# Patient Record
Sex: Female | Born: 1960 | Race: White | Hispanic: No | Marital: Married | Smoking: Current every day smoker
Health system: Southern US, Community
[De-identification: ages and names within clinical notes are randomized; demographics above are authoritative.]

## PROBLEM LIST (undated history)

## (undated) DIAGNOSIS — K5790 Diverticulosis of intestine, part unspecified, without perforation or abscess without bleeding: Secondary | ICD-10-CM

## (undated) DIAGNOSIS — G4761 Periodic limb movement disorder: Secondary | ICD-10-CM

## (undated) DIAGNOSIS — G4700 Insomnia, unspecified: Secondary | ICD-10-CM

## (undated) DIAGNOSIS — L309 Dermatitis, unspecified: Secondary | ICD-10-CM

## (undated) DIAGNOSIS — M25511 Pain in right shoulder: Secondary | ICD-10-CM

## (undated) DIAGNOSIS — Z87891 Personal history of nicotine dependence: Secondary | ICD-10-CM

## (undated) DIAGNOSIS — F419 Anxiety disorder, unspecified: Secondary | ICD-10-CM

## (undated) HISTORY — PX: OTHER SURGICAL HISTORY: SHX169

## (undated) HISTORY — DX: Diverticulosis of intestine, part unspecified, without perforation or abscess without bleeding: K57.90

## (undated) HISTORY — DX: Periodic limb movement disorder: G47.61

## (undated) HISTORY — DX: Dermatitis, unspecified: L30.9

## (undated) HISTORY — DX: Anxiety disorder, unspecified: F41.9

## (undated) HISTORY — DX: Insomnia, unspecified: G47.00

## (undated) HISTORY — DX: Pain in right shoulder: M25.511

## (undated) HISTORY — DX: Personal history of nicotine dependence: Z87.891

---

## 2007-04-03 ENCOUNTER — Other Ambulatory Visit: Admission: RE | Admit: 2007-04-03 | Discharge: 2007-04-03 | Payer: Self-pay | Admitting: Family Medicine

## 2010-03-01 ENCOUNTER — Emergency Department (HOSPITAL_COMMUNITY): Admission: EM | Admit: 2010-03-01 | Discharge: 2010-03-02 | Payer: Self-pay | Admitting: Emergency Medicine

## 2010-08-13 LAB — POCT CARDIAC MARKERS: Troponin i, poc: <0.05 ng/mL (ref 0.00–0.09)

## 2010-08-13 LAB — BASIC METABOLIC PANEL
BUN: 10 mg/dL (ref 6–23)
Calcium: 9.6 mg/dL (ref 8.4–10.5)
Chloride: 106 mEq/L (ref 96–112)
Creatinine, Ser: 0.64 mg/dL (ref 0.4–1.2)

## 2010-08-13 LAB — CBC
MCV: 94.2 fL (ref 78.0–100.0)
Platelets: 253 10*3/uL (ref 150–400)
RDW: 13.7 % (ref 11.5–15.5)
WBC: 7.1 10*3/uL (ref 4.0–10.5)

## 2010-08-13 LAB — DIFFERENTIAL
Basophils Absolute: 0 10*3/uL (ref 0.0–0.1)
Eosinophils Absolute: 0.3 10*3/uL (ref 0.0–0.7)
Eosinophils Relative: 4 % (ref 0–5)
Lymphocytes Relative: 42 % (ref 12–46)
Neutrophils Relative %: 46 % (ref 43–77)

## 2012-07-26 ENCOUNTER — Other Ambulatory Visit (HOSPITAL_COMMUNITY)
Admission: RE | Admit: 2012-07-26 | Discharge: 2012-07-26 | Disposition: A | Discharge status: Home / Self Care | Payer: Self-pay | Source: Ambulatory Visit | Attending: Family Medicine | Admitting: Family Medicine

## 2012-07-26 ENCOUNTER — Other Ambulatory Visit: Payer: Self-pay | Admitting: Family Medicine

## 2012-07-26 DIAGNOSIS — Z1151 Encounter for screening for human papillomavirus (HPV): Secondary | ICD-10-CM | POA: Insufficient documentation

## 2012-07-26 DIAGNOSIS — Z124 Encounter for screening for malignant neoplasm of cervix: Secondary | ICD-10-CM | POA: Insufficient documentation

## 2013-10-17 ENCOUNTER — Encounter: Payer: Self-pay | Admitting: Cardiology

## 2014-03-11 ENCOUNTER — Other Ambulatory Visit: Payer: Self-pay | Admitting: Family Medicine

## 2014-03-11 DIAGNOSIS — R109 Unspecified abdominal pain: Secondary | ICD-10-CM

## 2014-03-19 ENCOUNTER — Ambulatory Visit
Admission: RE | Admit: 2014-03-19 | Discharge: 2014-03-19 | Disposition: A | Discharge status: Home / Self Care | Payer: BC Managed Care – PPO | Source: Ambulatory Visit | Attending: Family Medicine | Admitting: Family Medicine

## 2014-03-19 DIAGNOSIS — R109 Unspecified abdominal pain: Secondary | ICD-10-CM

## 2014-07-31 ENCOUNTER — Other Ambulatory Visit: Payer: Self-pay | Admitting: Family Medicine

## 2014-07-31 ENCOUNTER — Ambulatory Visit
Admission: RE | Admit: 2014-07-31 | Discharge: 2014-07-31 | Disposition: A | Discharge status: Home / Self Care | Payer: BLUE CROSS/BLUE SHIELD | Source: Ambulatory Visit | Attending: Family Medicine | Admitting: Family Medicine

## 2014-07-31 DIAGNOSIS — W19XXXA Unspecified fall, initial encounter: Secondary | ICD-10-CM

## 2015-11-10 IMAGING — CR DG RIBS 2V*L*
2 series · 2 of 2 positions shown · non-contrast
Comparison: Chest radiograph 07/31/2014 and 03/01/2010.

CLINICAL DATA: Trauma, initial encounter. Patient tripped and fell,
landing on left upper chest. Moderate and persistent pain in the
left lateral upper chest with painful breathing. Pain is
nonradiating. No swelling or bruising.

EXAM:
LEFT RIBS - 2 VIEW

[view not recorded (1 of 2)]
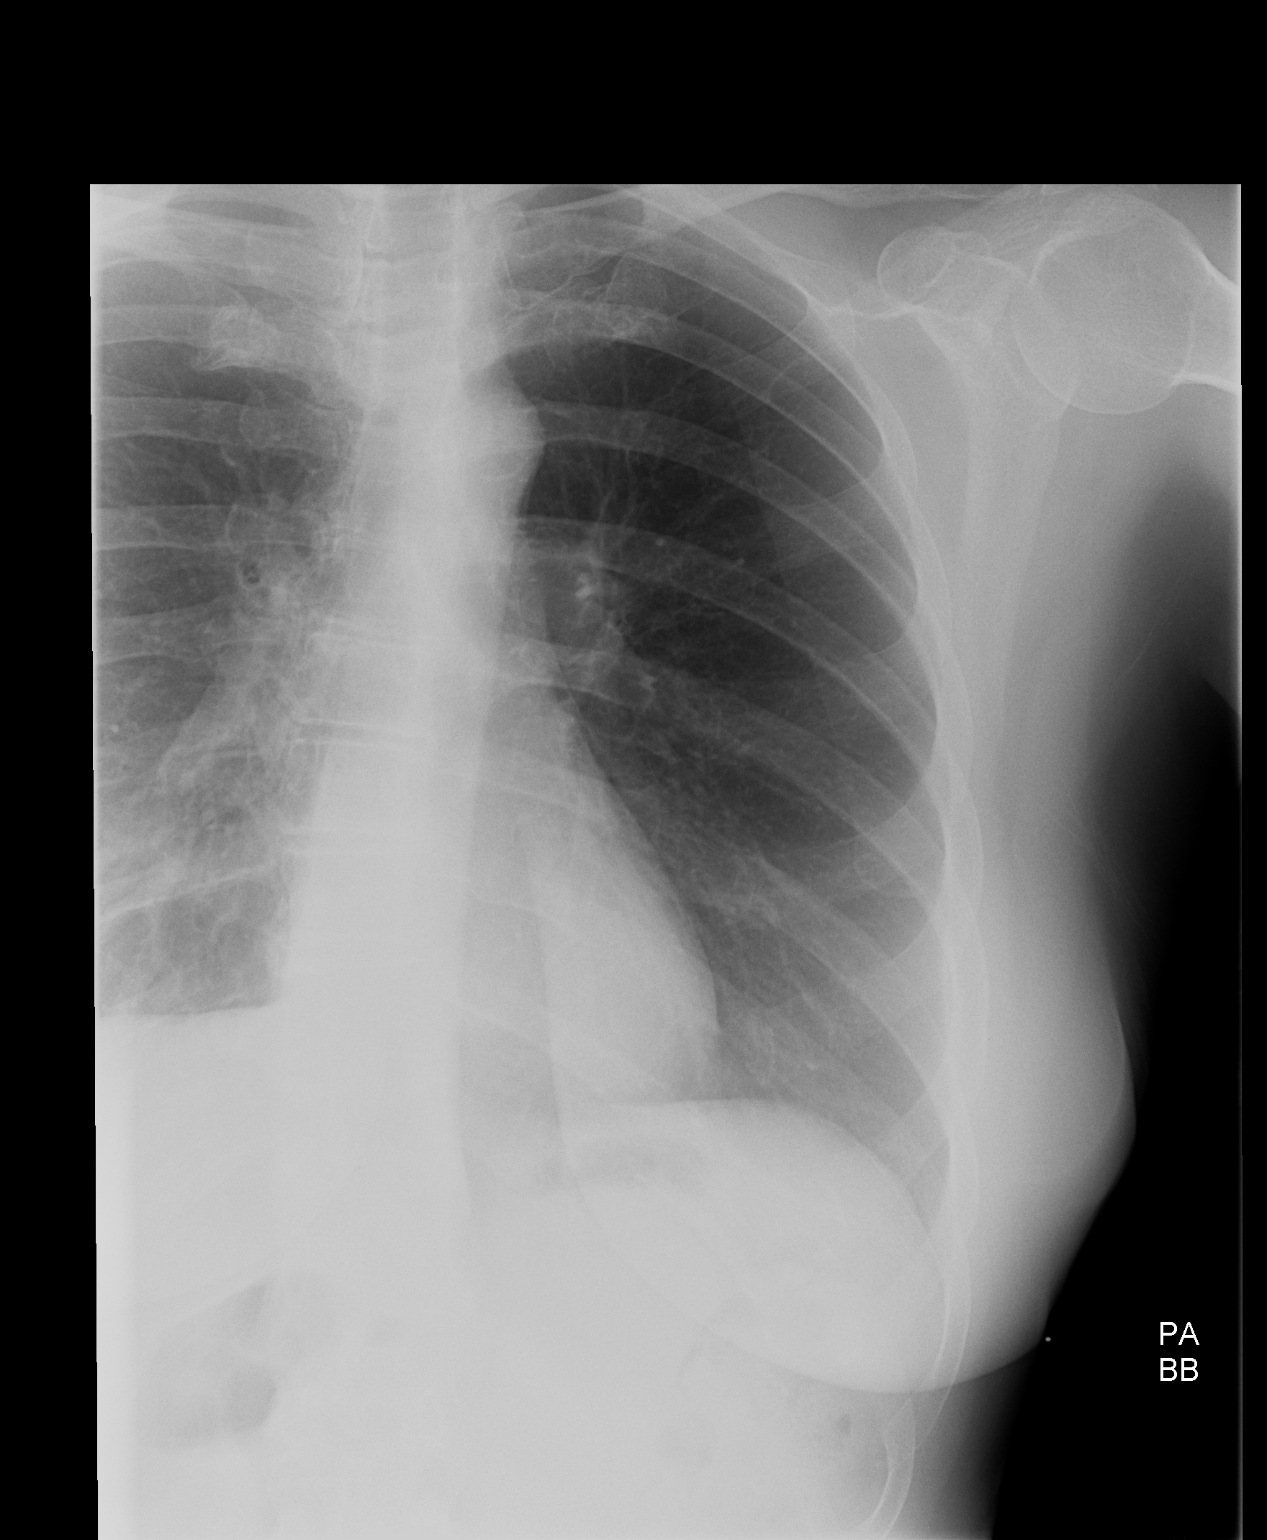

[view not recorded (2 of 2)]
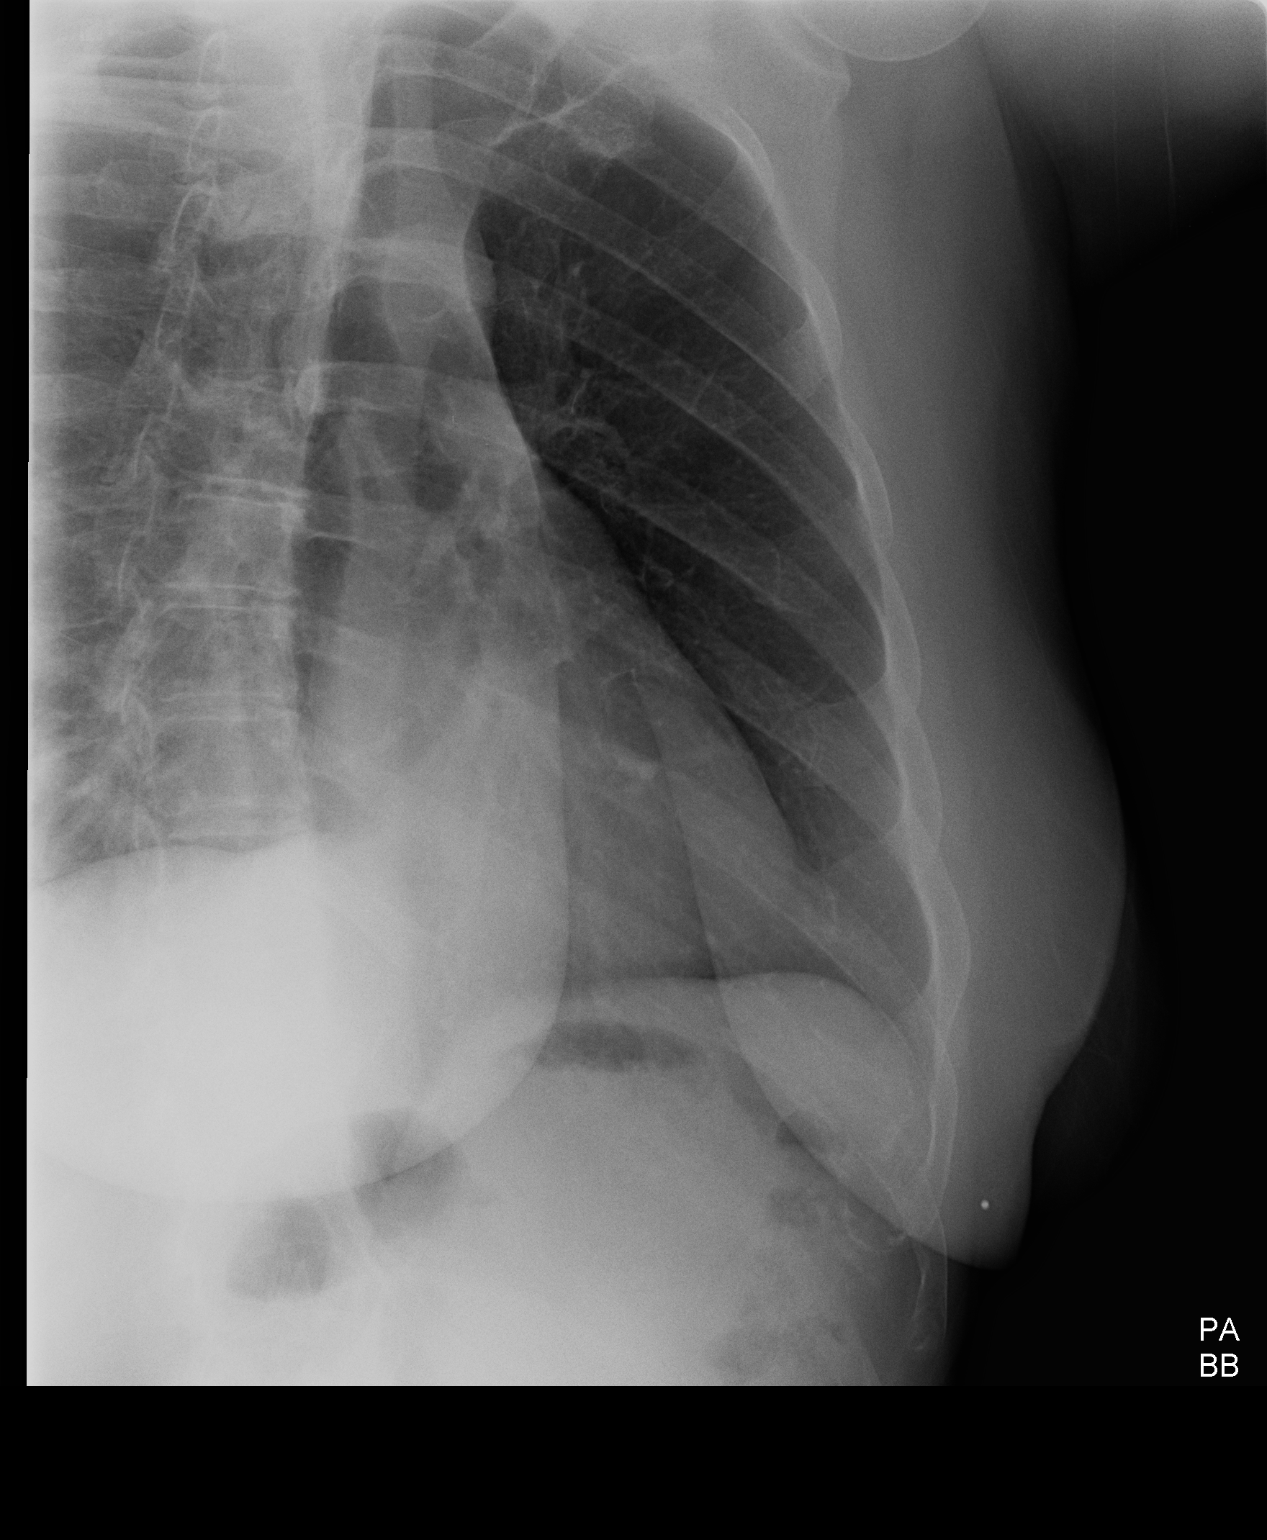

[2 of 2 positions shown; findings below may reference images not displayed]

FINDINGS: No acute osseous abnormality.
IMPRESSION: No acute osseous abnormality.  Chest radiograph dictated separately.

## 2015-11-10 IMAGING — CR DG CHEST 2V
2 series · 2 of 2 positions shown · non-contrast
Comparison: 03/01/2010.

CLINICAL DATA: Initial encounter, trauma. Tripped over someone's
foot and fell last night, landing on left upper chest. Moderate and
persistent pain in the left lateral upper chest with painful
breathing. Pain is nonradiating. No swelling or bruising.

EXAM:
CHEST  2 VIEW

[view not recorded (1 of 2)]
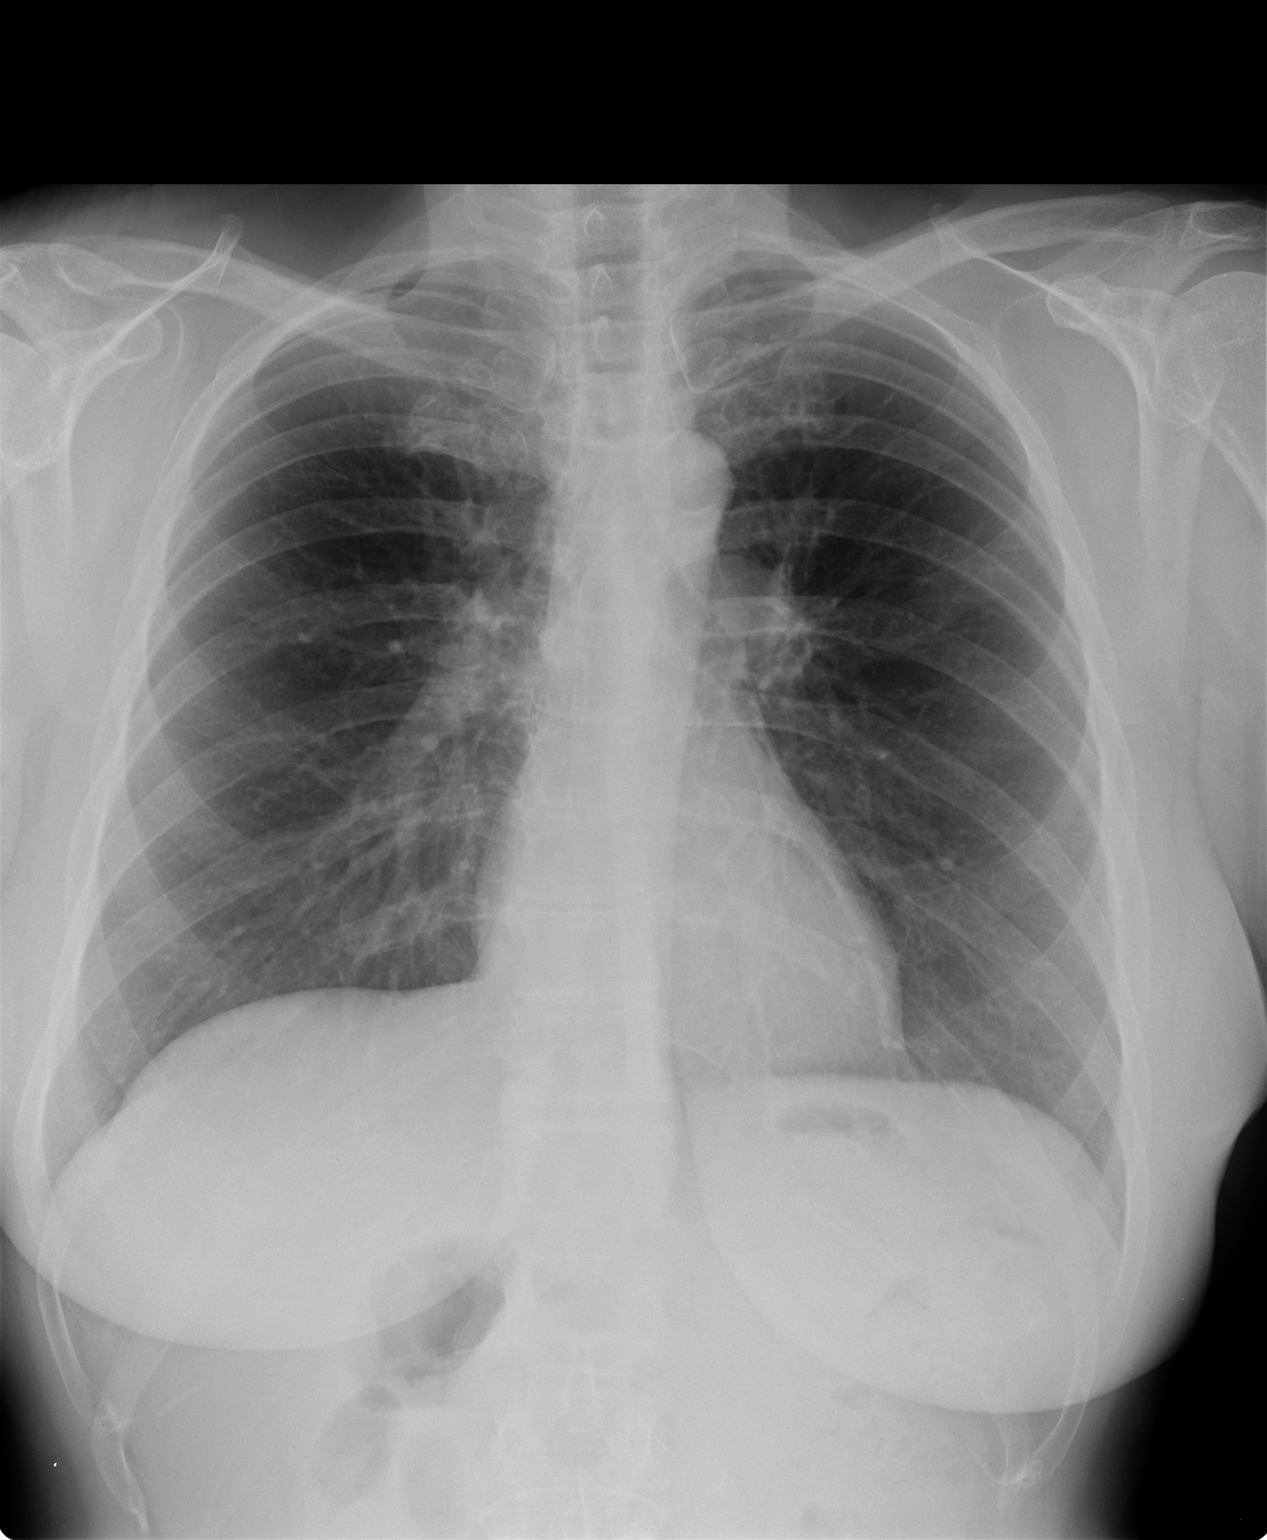

[view not recorded (2 of 2)]
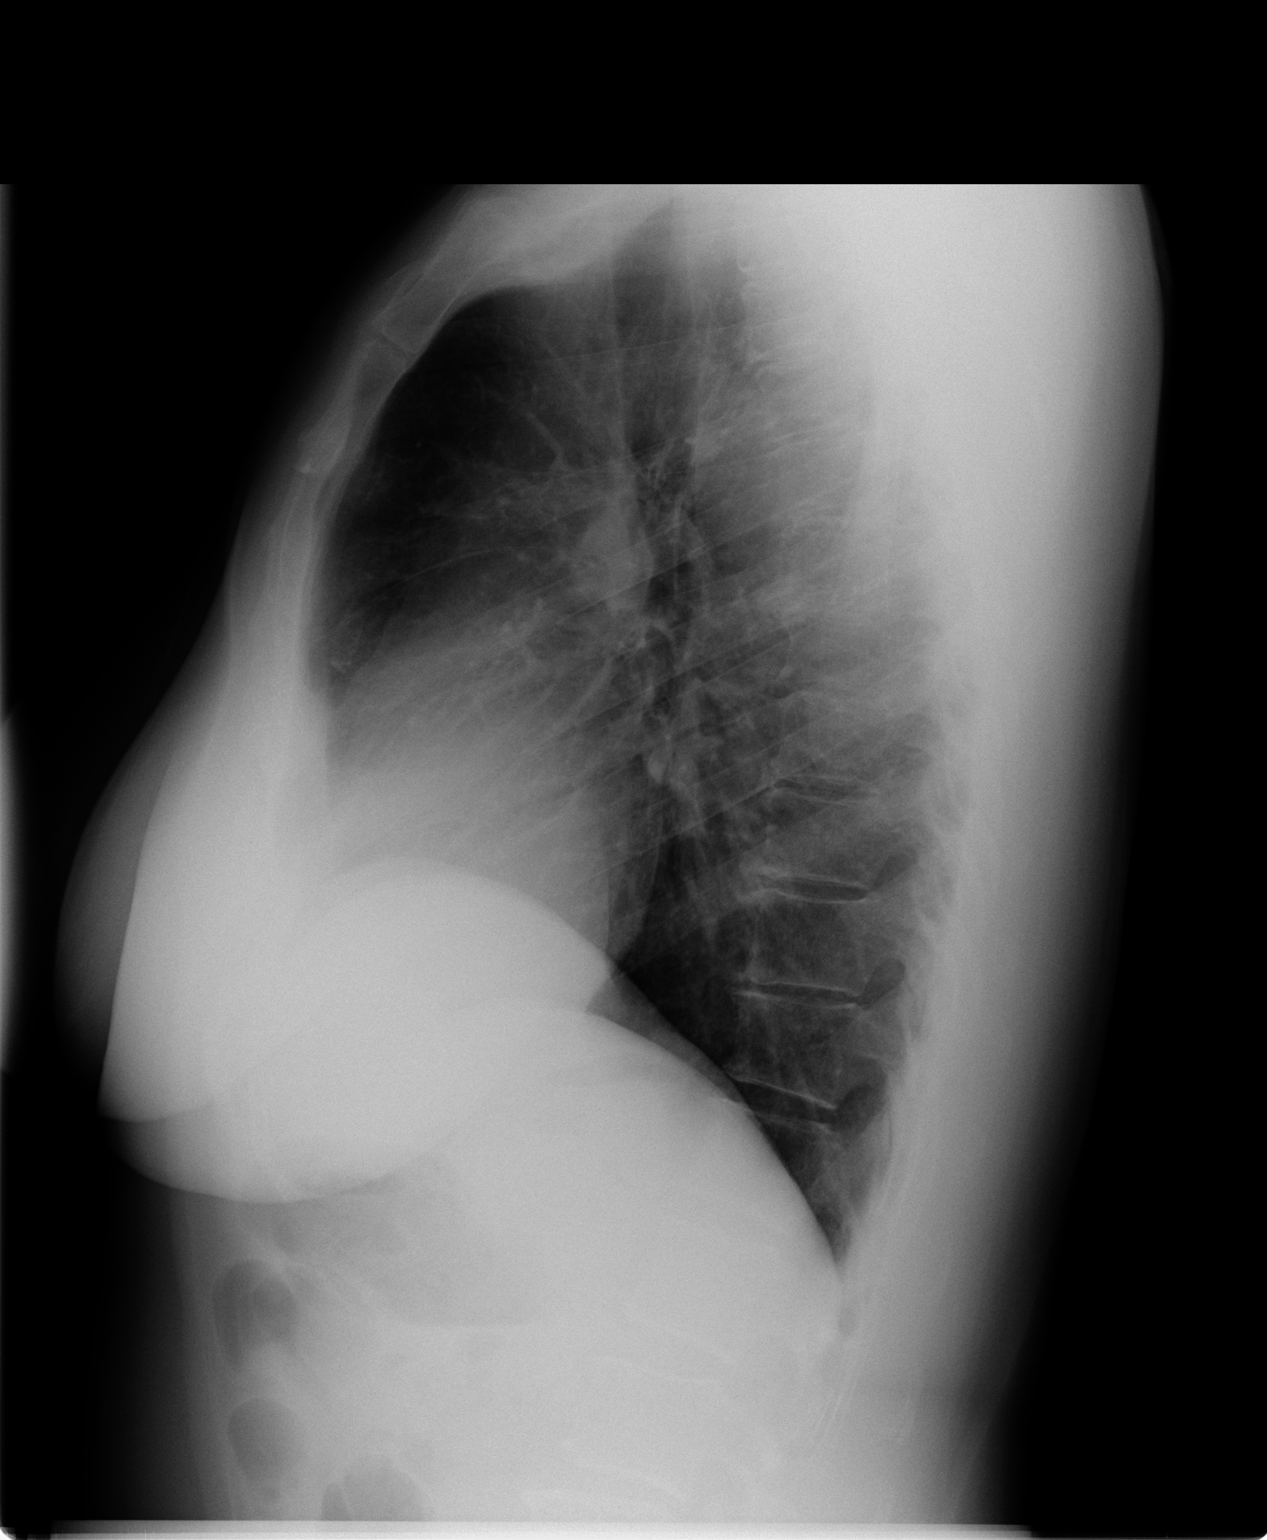

[2 of 2 positions shown; findings below may reference images not displayed]

FINDINGS: Trachea is midline. Heart size normal. Lungs are hyperinflated but
clear. No pleural fluid. No pneumothorax.
IMPRESSION: No acute findings.

## 2018-04-06 DIAGNOSIS — J019 Acute sinusitis, unspecified: Secondary | ICD-10-CM | POA: Diagnosis not present

## 2018-04-06 DIAGNOSIS — L309 Dermatitis, unspecified: Secondary | ICD-10-CM | POA: Diagnosis not present

## 2018-04-24 DIAGNOSIS — R0982 Postnasal drip: Secondary | ICD-10-CM | POA: Diagnosis not present

## 2018-04-24 DIAGNOSIS — Z6825 Body mass index (BMI) 25.0-25.9, adult: Secondary | ICD-10-CM | POA: Diagnosis not present

## 2018-08-01 DIAGNOSIS — L309 Dermatitis, unspecified: Secondary | ICD-10-CM | POA: Diagnosis not present

## 2019-02-02 DIAGNOSIS — D229 Melanocytic nevi, unspecified: Secondary | ICD-10-CM | POA: Diagnosis not present

## 2019-02-02 DIAGNOSIS — L821 Other seborrheic keratosis: Secondary | ICD-10-CM | POA: Diagnosis not present

## 2019-02-02 DIAGNOSIS — L814 Other melanin hyperpigmentation: Secondary | ICD-10-CM | POA: Diagnosis not present

## 2019-02-02 DIAGNOSIS — L281 Prurigo nodularis: Secondary | ICD-10-CM | POA: Diagnosis not present

## 2019-02-07 DIAGNOSIS — Z131 Encounter for screening for diabetes mellitus: Secondary | ICD-10-CM | POA: Diagnosis not present

## 2019-02-07 DIAGNOSIS — Z0000 Encounter for general adult medical examination without abnormal findings: Secondary | ICD-10-CM | POA: Diagnosis not present

## 2019-02-07 DIAGNOSIS — Z114 Encounter for screening for human immunodeficiency virus [HIV]: Secondary | ICD-10-CM | POA: Diagnosis not present

## 2019-02-07 DIAGNOSIS — Z1159 Encounter for screening for other viral diseases: Secondary | ICD-10-CM | POA: Diagnosis not present

## 2019-02-07 DIAGNOSIS — E559 Vitamin D deficiency, unspecified: Secondary | ICD-10-CM | POA: Diagnosis not present

## 2019-02-09 ENCOUNTER — Other Ambulatory Visit: Payer: Self-pay

## 2019-02-09 DIAGNOSIS — Z20822 Contact with and (suspected) exposure to covid-19: Secondary | ICD-10-CM

## 2019-02-11 LAB — NOVEL CORONAVIRUS, NAA: SARS-CoV-2, NAA: NOT DETECTED

## 2019-02-15 DIAGNOSIS — Z1211 Encounter for screening for malignant neoplasm of colon: Secondary | ICD-10-CM | POA: Diagnosis not present

## 2019-03-09 DIAGNOSIS — Z2821 Immunization not carried out because of patient refusal: Secondary | ICD-10-CM | POA: Diagnosis not present

## 2019-03-09 DIAGNOSIS — R635 Abnormal weight gain: Secondary | ICD-10-CM | POA: Diagnosis not present

## 2019-03-09 DIAGNOSIS — Z1231 Encounter for screening mammogram for malignant neoplasm of breast: Secondary | ICD-10-CM | POA: Diagnosis not present

## 2019-04-23 DIAGNOSIS — R635 Abnormal weight gain: Secondary | ICD-10-CM | POA: Diagnosis not present

## 2019-08-24 ENCOUNTER — Ambulatory Visit: Payer: BC Managed Care – PPO | Attending: Internal Medicine

## 2019-08-24 DIAGNOSIS — Z23 Encounter for immunization: Secondary | ICD-10-CM

## 2019-08-24 NOTE — Progress Notes (Signed)
   Covid-19 Vaccination Clinic  Name:  Dominique Velasquez    MRN: [id number] DOB: 03-11-1961  08/24/2019  [name] was observed post Covid-19 immunization for 15 minutes without incident. She was provided with Vaccine Information Sheet and instruction to access the V-Safe system.   [name] was instructed to call 911 with any severe reactions post vaccine: Marland Kitchen Difficulty breathing  . Swelling of face and throat  . A fast heartbeat  . A bad rash all over body  . Dizziness and weakness   Immunizations Administered    Name Date Dose VIS Date Route   Pfizer COVID-19 Vaccine 08/24/2019  2:04 PM 0.3 mL 05/11/2019 Intramuscular   Manufacturer: [employer], [employer]   Lot: [id number]   NDC: [address]-8

## 2019-09-19 ENCOUNTER — Ambulatory Visit: Payer: BC Managed Care – PPO | Attending: Internal Medicine

## 2019-09-19 DIAGNOSIS — Z23 Encounter for immunization: Secondary | ICD-10-CM

## 2019-09-19 NOTE — Progress Notes (Signed)
   Covid-19 Vaccination Clinic  Name:  Dominique Velasquez    MRN: [id number] DOB: 07/10/1960  09/19/2019  Ms. Dominique Velasquez was observed post Covid-19 immunization for 15 minutes without incident. She was provided with Vaccine Information Sheet and instruction to access the V-Safe system.   Ms. Dominique Velasquez was instructed to call 911 with any severe reactions post vaccine: Marland Kitchen Difficulty breathing  . Swelling of face and throat  . A fast heartbeat  . A bad rash all over body  . Dizziness and weakness   Immunizations Administered    Name Date Dose VIS Date Route   Pfizer COVID-19 Vaccine 09/19/2019  2:00 PM 0.3 mL 07/25/2018 Intramuscular   Manufacturer: [employer], [employer]   Lot: [id number]   NDC: [address]-8

## 2019-11-08 DIAGNOSIS — R6884 Jaw pain: Secondary | ICD-10-CM | POA: Diagnosis not present

## 2019-11-08 DIAGNOSIS — J0100 Acute maxillary sinusitis, unspecified: Secondary | ICD-10-CM | POA: Diagnosis not present

## 2020-02-26 DIAGNOSIS — M1811 Unilateral primary osteoarthritis of first carpometacarpal joint, right hand: Secondary | ICD-10-CM | POA: Diagnosis not present

## 2020-02-26 DIAGNOSIS — M720 Palmar fascial fibromatosis [Dupuytren]: Secondary | ICD-10-CM | POA: Diagnosis not present

## 2020-10-16 DIAGNOSIS — K219 Gastro-esophageal reflux disease without esophagitis: Secondary | ICD-10-CM | POA: Diagnosis not present

## 2020-10-16 DIAGNOSIS — Z13220 Encounter for screening for lipoid disorders: Secondary | ICD-10-CM | POA: Diagnosis not present

## 2020-10-16 DIAGNOSIS — Z0000 Encounter for general adult medical examination without abnormal findings: Secondary | ICD-10-CM | POA: Diagnosis not present

## 2020-10-23 DIAGNOSIS — R197 Diarrhea, unspecified: Secondary | ICD-10-CM | POA: Diagnosis not present

## 2020-10-23 DIAGNOSIS — R109 Unspecified abdominal pain: Secondary | ICD-10-CM | POA: Diagnosis not present

## 2020-11-11 DIAGNOSIS — Z86010 Personal history of colonic polyps: Secondary | ICD-10-CM | POA: Diagnosis not present

## 2020-11-11 DIAGNOSIS — K219 Gastro-esophageal reflux disease without esophagitis: Secondary | ICD-10-CM | POA: Diagnosis not present

## 2020-11-11 DIAGNOSIS — R1013 Epigastric pain: Secondary | ICD-10-CM | POA: Diagnosis not present

## 2021-05-26 DIAGNOSIS — Z1211 Encounter for screening for malignant neoplasm of colon: Secondary | ICD-10-CM | POA: Diagnosis not present

## 2021-05-26 DIAGNOSIS — Z1212 Encounter for screening for malignant neoplasm of rectum: Secondary | ICD-10-CM | POA: Diagnosis not present

## 2021-06-03 LAB — EXTERNAL GENERIC LAB PROCEDURE: COLOGUARD: NEGATIVE

## 2021-09-17 DIAGNOSIS — K219 Gastro-esophageal reflux disease without esophagitis: Secondary | ICD-10-CM | POA: Diagnosis not present

## 2021-10-16 DIAGNOSIS — K219 Gastro-esophageal reflux disease without esophagitis: Secondary | ICD-10-CM | POA: Diagnosis not present

## 2021-10-16 DIAGNOSIS — R0989 Other specified symptoms and signs involving the circulatory and respiratory systems: Secondary | ICD-10-CM | POA: Diagnosis not present

## 2021-10-16 DIAGNOSIS — J310 Chronic rhinitis: Secondary | ICD-10-CM | POA: Diagnosis not present

## 2021-10-22 DIAGNOSIS — K219 Gastro-esophageal reflux disease without esophagitis: Secondary | ICD-10-CM | POA: Diagnosis not present

## 2021-10-22 DIAGNOSIS — J321 Chronic frontal sinusitis: Secondary | ICD-10-CM | POA: Diagnosis not present

## 2021-10-22 DIAGNOSIS — J310 Chronic rhinitis: Secondary | ICD-10-CM | POA: Diagnosis not present

## 2021-10-22 DIAGNOSIS — R0989 Other specified symptoms and signs involving the circulatory and respiratory systems: Secondary | ICD-10-CM | POA: Diagnosis not present

## 2021-12-14 DIAGNOSIS — R519 Headache, unspecified: Secondary | ICD-10-CM | POA: Diagnosis not present

## 2021-12-14 DIAGNOSIS — K219 Gastro-esophageal reflux disease without esophagitis: Secondary | ICD-10-CM | POA: Diagnosis not present

## 2022-06-18 ENCOUNTER — Encounter (HOSPITAL_BASED_OUTPATIENT_CLINIC_OR_DEPARTMENT_OTHER): Payer: Self-pay

## 2022-06-18 ENCOUNTER — Other Ambulatory Visit: Payer: Self-pay

## 2022-06-18 ENCOUNTER — Other Ambulatory Visit (HOSPITAL_BASED_OUTPATIENT_CLINIC_OR_DEPARTMENT_OTHER): Payer: Self-pay

## 2022-06-18 ENCOUNTER — Emergency Department (HOSPITAL_BASED_OUTPATIENT_CLINIC_OR_DEPARTMENT_OTHER)
Admission: EM | Admit: 2022-06-18 | Discharge: 2022-06-18 | Discharge status: Against Medical Advice | Payer: Self-pay | Attending: Emergency Medicine | Admitting: Emergency Medicine

## 2022-06-18 DIAGNOSIS — Z5321 Procedure and treatment not carried out due to patient leaving prior to being seen by health care provider: Secondary | ICD-10-CM | POA: Insufficient documentation

## 2022-06-18 DIAGNOSIS — F419 Anxiety disorder, unspecified: Secondary | ICD-10-CM | POA: Insufficient documentation

## 2022-07-06 DIAGNOSIS — M25512 Pain in left shoulder: Secondary | ICD-10-CM | POA: Diagnosis not present

## 2022-07-06 DIAGNOSIS — Z23 Encounter for immunization: Secondary | ICD-10-CM | POA: Diagnosis not present

## 2022-07-06 DIAGNOSIS — E785 Hyperlipidemia, unspecified: Secondary | ICD-10-CM | POA: Diagnosis not present

## 2022-07-06 DIAGNOSIS — E559 Vitamin D deficiency, unspecified: Secondary | ICD-10-CM | POA: Diagnosis not present

## 2022-07-06 DIAGNOSIS — Z0000 Encounter for general adult medical examination without abnormal findings: Secondary | ICD-10-CM | POA: Diagnosis not present

## 2022-07-06 DIAGNOSIS — F4320 Adjustment disorder, unspecified: Secondary | ICD-10-CM | POA: Diagnosis not present

## 2022-07-06 DIAGNOSIS — K219 Gastro-esophageal reflux disease without esophagitis: Secondary | ICD-10-CM | POA: Diagnosis not present

## 2022-07-12 DIAGNOSIS — M25512 Pain in left shoulder: Secondary | ICD-10-CM | POA: Diagnosis not present

## 2022-07-14 DIAGNOSIS — R0602 Shortness of breath: Secondary | ICD-10-CM | POA: Diagnosis not present

## 2022-07-14 DIAGNOSIS — E559 Vitamin D deficiency, unspecified: Secondary | ICD-10-CM | POA: Diagnosis not present

## 2022-07-14 DIAGNOSIS — Z1331 Encounter for screening for depression: Secondary | ICD-10-CM | POA: Diagnosis not present

## 2022-08-14 DIAGNOSIS — R635 Abnormal weight gain: Secondary | ICD-10-CM | POA: Diagnosis not present

## 2022-08-14 DIAGNOSIS — E782 Mixed hyperlipidemia: Secondary | ICD-10-CM | POA: Diagnosis not present

## 2022-08-14 DIAGNOSIS — K219 Gastro-esophageal reflux disease without esophagitis: Secondary | ICD-10-CM | POA: Diagnosis not present

## 2022-08-14 DIAGNOSIS — Z6826 Body mass index (BMI) 26.0-26.9, adult: Secondary | ICD-10-CM | POA: Diagnosis not present

## 2022-08-14 DIAGNOSIS — M25512 Pain in left shoulder: Secondary | ICD-10-CM | POA: Diagnosis not present

## 2022-08-28 DIAGNOSIS — R5383 Other fatigue: Secondary | ICD-10-CM | POA: Diagnosis not present

## 2022-08-28 DIAGNOSIS — M25512 Pain in left shoulder: Secondary | ICD-10-CM | POA: Diagnosis not present

## 2022-08-28 DIAGNOSIS — Z79899 Other long term (current) drug therapy: Secondary | ICD-10-CM | POA: Diagnosis not present

## 2022-08-28 DIAGNOSIS — R635 Abnormal weight gain: Secondary | ICD-10-CM | POA: Diagnosis not present

## 2022-08-28 DIAGNOSIS — E559 Vitamin D deficiency, unspecified: Secondary | ICD-10-CM | POA: Diagnosis not present

## 2022-08-28 DIAGNOSIS — Z131 Encounter for screening for diabetes mellitus: Secondary | ICD-10-CM | POA: Diagnosis not present

## 2022-08-28 DIAGNOSIS — R0602 Shortness of breath: Secondary | ICD-10-CM | POA: Diagnosis not present

## 2022-08-28 DIAGNOSIS — K219 Gastro-esophageal reflux disease without esophagitis: Secondary | ICD-10-CM | POA: Diagnosis not present

## 2022-08-28 DIAGNOSIS — E782 Mixed hyperlipidemia: Secondary | ICD-10-CM | POA: Diagnosis not present

## 2022-08-28 DIAGNOSIS — Z6825 Body mass index (BMI) 25.0-25.9, adult: Secondary | ICD-10-CM | POA: Diagnosis not present

## 2022-09-02 DIAGNOSIS — Z79899 Other long term (current) drug therapy: Secondary | ICD-10-CM | POA: Diagnosis not present

## 2022-09-23 DIAGNOSIS — Z79899 Other long term (current) drug therapy: Secondary | ICD-10-CM | POA: Diagnosis not present

## 2022-09-23 DIAGNOSIS — K219 Gastro-esophageal reflux disease without esophagitis: Secondary | ICD-10-CM | POA: Diagnosis not present

## 2022-09-23 DIAGNOSIS — M25512 Pain in left shoulder: Secondary | ICD-10-CM | POA: Diagnosis not present

## 2022-09-23 DIAGNOSIS — E782 Mixed hyperlipidemia: Secondary | ICD-10-CM | POA: Diagnosis not present

## 2022-09-23 DIAGNOSIS — E663 Overweight: Secondary | ICD-10-CM | POA: Diagnosis not present

## 2022-09-27 DIAGNOSIS — Z780 Asymptomatic menopausal state: Secondary | ICD-10-CM | POA: Diagnosis not present

## 2022-09-29 DIAGNOSIS — Z79899 Other long term (current) drug therapy: Secondary | ICD-10-CM | POA: Diagnosis not present

## 2022-10-12 DIAGNOSIS — M25512 Pain in left shoulder: Secondary | ICD-10-CM | POA: Diagnosis not present

## 2022-11-24 DIAGNOSIS — M7502 Adhesive capsulitis of left shoulder: Secondary | ICD-10-CM | POA: Diagnosis not present

## 2023-05-04 ENCOUNTER — Other Ambulatory Visit: Payer: Self-pay | Admitting: Family Medicine

## 2023-05-04 DIAGNOSIS — Z1231 Encounter for screening mammogram for malignant neoplasm of breast: Secondary | ICD-10-CM

## 2023-06-08 ENCOUNTER — Ambulatory Visit
Admission: RE | Admit: 2023-06-08 | Discharge: 2023-06-08 | Disposition: A | Discharge status: Home / Self Care | Payer: BC Managed Care – PPO | Source: Ambulatory Visit | Attending: Family Medicine | Admitting: Family Medicine

## 2023-06-08 DIAGNOSIS — Z1231 Encounter for screening mammogram for malignant neoplasm of breast: Secondary | ICD-10-CM

## 2023-07-11 DIAGNOSIS — E782 Mixed hyperlipidemia: Secondary | ICD-10-CM | POA: Diagnosis not present

## 2023-07-11 DIAGNOSIS — Z79899 Other long term (current) drug therapy: Secondary | ICD-10-CM | POA: Diagnosis not present

## 2023-07-11 DIAGNOSIS — Z0000 Encounter for general adult medical examination without abnormal findings: Secondary | ICD-10-CM | POA: Diagnosis not present

## 2023-07-11 DIAGNOSIS — E663 Overweight: Secondary | ICD-10-CM | POA: Diagnosis not present

## 2023-07-11 DIAGNOSIS — K219 Gastro-esophageal reflux disease without esophagitis: Secondary | ICD-10-CM | POA: Diagnosis not present

## 2023-11-23 DIAGNOSIS — L400 Psoriasis vulgaris: Secondary | ICD-10-CM | POA: Diagnosis not present

## 2023-11-23 DIAGNOSIS — L821 Other seborrheic keratosis: Secondary | ICD-10-CM | POA: Diagnosis not present

## 2023-11-23 DIAGNOSIS — L578 Other skin changes due to chronic exposure to nonionizing radiation: Secondary | ICD-10-CM | POA: Diagnosis not present

## 2023-11-23 DIAGNOSIS — D1801 Hemangioma of skin and subcutaneous tissue: Secondary | ICD-10-CM | POA: Diagnosis not present

## 2024-02-20 DIAGNOSIS — K5900 Constipation, unspecified: Secondary | ICD-10-CM | POA: Diagnosis not present

## 2024-02-20 DIAGNOSIS — Z1211 Encounter for screening for malignant neoplasm of colon: Secondary | ICD-10-CM | POA: Diagnosis not present

## 2024-02-20 DIAGNOSIS — K219 Gastro-esophageal reflux disease without esophagitis: Secondary | ICD-10-CM | POA: Diagnosis not present

## 2024-03-08 DIAGNOSIS — Z6826 Body mass index (BMI) 26.0-26.9, adult: Secondary | ICD-10-CM | POA: Diagnosis not present

## 2024-03-08 DIAGNOSIS — F5081 Binge eating disorder, mild: Secondary | ICD-10-CM | POA: Diagnosis not present

## 2024-03-26 DIAGNOSIS — K219 Gastro-esophageal reflux disease without esophagitis: Secondary | ICD-10-CM | POA: Diagnosis not present

## 2024-03-26 DIAGNOSIS — K639 Disease of intestine, unspecified: Secondary | ICD-10-CM | POA: Diagnosis not present

## 2024-04-25 DIAGNOSIS — D122 Benign neoplasm of ascending colon: Secondary | ICD-10-CM | POA: Diagnosis not present

## 2024-04-25 DIAGNOSIS — K648 Other hemorrhoids: Secondary | ICD-10-CM | POA: Diagnosis not present

## 2024-04-25 DIAGNOSIS — Z09 Encounter for follow-up examination after completed treatment for conditions other than malignant neoplasm: Secondary | ICD-10-CM | POA: Diagnosis not present

## 2024-04-25 DIAGNOSIS — K5730 Diverticulosis of large intestine without perforation or abscess without bleeding: Secondary | ICD-10-CM | POA: Diagnosis not present

## 2024-04-25 DIAGNOSIS — Z860101 Personal history of adenomatous and serrated colon polyps: Secondary | ICD-10-CM | POA: Diagnosis not present

## 2024-05-15 DIAGNOSIS — K219 Gastro-esophageal reflux disease without esophagitis: Secondary | ICD-10-CM | POA: Diagnosis not present

## 2024-05-15 DIAGNOSIS — Z1331 Encounter for screening for depression: Secondary | ICD-10-CM | POA: Diagnosis not present

## 2024-05-15 DIAGNOSIS — E8889 Other specified metabolic disorders: Secondary | ICD-10-CM | POA: Diagnosis not present

## 2024-05-15 DIAGNOSIS — E663 Overweight: Secondary | ICD-10-CM | POA: Diagnosis not present

## 2024-05-15 DIAGNOSIS — Z131 Encounter for screening for diabetes mellitus: Secondary | ICD-10-CM | POA: Diagnosis not present

## 2024-05-15 DIAGNOSIS — R5382 Chronic fatigue, unspecified: Secondary | ICD-10-CM | POA: Diagnosis not present
